# Patient Record
Sex: Male | Born: 1965 | Race: White | Hispanic: No | Marital: Married | Smoking: Never smoker
Health system: Southern US, Community
[De-identification: ages and names within clinical notes are randomized; demographics above are authoritative.]

## PROBLEM LIST (undated history)

## (undated) DIAGNOSIS — Z87442 Personal history of urinary calculi: Secondary | ICD-10-CM

## (undated) DIAGNOSIS — I10 Essential (primary) hypertension: Secondary | ICD-10-CM

---

## 2004-02-23 ENCOUNTER — Emergency Department: Payer: Self-pay | Admitting: Emergency Medicine

## 2006-01-25 ENCOUNTER — Emergency Department: Payer: Self-pay | Admitting: Emergency Medicine

## 2006-04-27 ENCOUNTER — Ambulatory Visit: Payer: Self-pay | Admitting: Urology

## 2011-03-02 HISTORY — PX: SEPTOPLASTY: SUR1290

## 2012-01-01 ENCOUNTER — Emergency Department: Payer: Self-pay | Admitting: Emergency Medicine

## 2012-01-01 LAB — CBC
HCT: 39.7 % — ABNORMAL LOW (ref 40.0–52.0)
HGB: 14.1 g/dL (ref 13.0–18.0)
MCH: 29.6 pg (ref 26.0–34.0)
MCHC: 35.5 g/dL (ref 32.0–36.0)
Platelet: 274 10*3/uL (ref 150–440)

## 2012-01-01 LAB — URINALYSIS, COMPLETE
Bacteria: NONE SEEN
Bilirubin,UR: NEGATIVE
Glucose,UR: 50 mg/dL
Ketone: NEGATIVE
Leukocyte Esterase: NEGATIVE
Nitrite: NEGATIVE
Ph: 8
Protein: NEGATIVE
RBC,UR: 28 /HPF
Specific Gravity: 1.023
Squamous Epithelial: NONE SEEN
WBC UR: 1 /HPF

## 2012-01-01 LAB — COMPREHENSIVE METABOLIC PANEL WITH GFR
Albumin: 3.9 g/dL
Alkaline Phosphatase: 66 U/L
Anion Gap: 7
BUN: 11 mg/dL
Bilirubin,Total: 0.4 mg/dL
Calcium, Total: 8.9 mg/dL
Chloride: 107 mmol/L
Co2: 29 mmol/L
Creatinine: 0.89 mg/dL
EGFR (African American): >60
EGFR (Non-African Amer.): >60
Glucose: 90 mg/dL
Osmolality: 284
Potassium: 3.8 mmol/L
SGOT(AST): 24 U/L
SGPT (ALT): 29 U/L
Sodium: 143 mmol/L
Total Protein: 7.1 g/dL

## 2012-01-01 LAB — PROTIME-INR: Prothrombin Time: 12 secs (ref 11.5–14.7)

## 2012-01-11 ENCOUNTER — Ambulatory Visit: Payer: Self-pay | Admitting: Otolaryngology

## 2012-05-05 ENCOUNTER — Ambulatory Visit: Payer: Self-pay | Admitting: Orthopedic Surgery

## 2012-10-11 ENCOUNTER — Emergency Department: Payer: Self-pay | Admitting: Emergency Medicine

## 2012-10-12 ENCOUNTER — Emergency Department: Payer: Self-pay | Admitting: Emergency Medicine

## 2012-10-24 ENCOUNTER — Ambulatory Visit: Payer: Self-pay | Admitting: Orthopedic Surgery

## 2012-11-22 ENCOUNTER — Ambulatory Visit: Payer: Self-pay | Admitting: Orthopedic Surgery

## 2012-12-05 ENCOUNTER — Emergency Department: Payer: Self-pay | Admitting: Emergency Medicine

## 2012-12-05 LAB — CBC
HCT: 41 % (ref 40.0–52.0)
HGB: 14.3 g/dL (ref 13.0–18.0)
MCHC: 34.9 g/dL (ref 32.0–36.0)
MCV: 84 fL (ref 80–100)
Platelet: 337 10*3/uL (ref 150–440)
WBC: 8.4 10*3/uL (ref 3.8–10.6)

## 2012-12-05 LAB — URINALYSIS, COMPLETE
Glucose,UR: 50 mg/dL (ref 0–75)
Hyaline Cast: 2
Ketone: NEGATIVE
Nitrite: NEGATIVE
Ph: 6 (ref 4.5–8.0)
Protein: 100
RBC,UR: 94 /HPF (ref 0–5)
WBC UR: 4 /HPF (ref 0–5)

## 2012-12-05 LAB — COMPREHENSIVE METABOLIC PANEL
Alkaline Phosphatase: 84 U/L (ref 50–136)
Anion Gap: 4 — ABNORMAL LOW (ref 7–16)
BUN: 12 mg/dL (ref 7–18)
Bilirubin,Total: 1 mg/dL (ref 0.2–1.0)
Calcium, Total: 9.7 mg/dL (ref 8.5–10.1)
Chloride: 102 mmol/L (ref 98–107)
Co2: 30 mmol/L (ref 21–32)
Creatinine: 1.2 mg/dL (ref 0.60–1.30)
EGFR (African American): >60
EGFR (Non-African Amer.): >60
Glucose: 136 mg/dL — ABNORMAL HIGH (ref 65–99)
SGOT(AST): 21 U/L (ref 15–37)
SGPT (ALT): 32 U/L (ref 12–78)

## 2012-12-07 LAB — URINE CULTURE

## 2013-01-02 ENCOUNTER — Ambulatory Visit: Payer: Self-pay | Admitting: Orthopedic Surgery

## 2013-01-03 ENCOUNTER — Encounter: Payer: Self-pay | Admitting: Podiatry

## 2013-01-03 ENCOUNTER — Ambulatory Visit (INDEPENDENT_AMBULATORY_CARE_PROVIDER_SITE_OTHER): Payer: BC Managed Care – PPO | Admitting: Podiatry

## 2013-01-03 VITALS — BP 136/82 | HR 63 | Resp 16 | Ht 70.0 in | Wt 170.0 lb

## 2013-01-03 DIAGNOSIS — Z79899 Other long term (current) drug therapy: Secondary | ICD-10-CM

## 2013-01-03 DIAGNOSIS — B351 Tinea unguium: Secondary | ICD-10-CM

## 2013-01-03 MED ORDER — TERBINAFINE HCL 250 MG PO TABS: 250.0000 mg | ORAL_TABLET | Freq: Every day | ORAL | Status: DC

## 2013-01-03 NOTE — Progress Notes (Signed)
Terez presents today for followup of his lab results. Labs came back positive for fungus.  Objective: Palpable pulses bilateral no calf pain. It dystrophic mycotic nails.  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  Plan: Start oral Lamisil 250 mg 1 by mouth daily. Since him to [facility] for his liver profile and CBC. Should this come back abnormal I will notify him immediately otherwise I will followup with him in one month.

## 2013-01-04 LAB — CBC WITH DIFFERENTIAL/PLATELET
Basos: 1 %
Eos: 4 %
HCT: 39.4 % (ref 37.5–51.0)
Hemoglobin: 13.2 g/dL (ref 12.6–17.7)
Immature Granulocytes: 0 %
Lymphocytes Absolute: 2 10*3/uL (ref 0.7–3.1)
Lymphs: 26 %
MCV: 86 fL (ref 79–97)
Monocytes Absolute: 0.6 10*3/uL (ref 0.1–0.9)
Monocytes: 8 %
Neutrophils Absolute: 4.7 10*3/uL (ref 1.4–7.0)
RBC: 4.56 x10E6/uL (ref 4.14–5.80)
RDW: 13.3 % (ref 12.3–15.4)
WBC: 7.7 10*3/uL (ref 3.4–10.8)

## 2013-01-04 LAB — HEPATIC FUNCTION PANEL
ALT: 26 IU/L (ref 0–44)
AST: 19 IU/L (ref 0–40)
Albumin: 4.5 g/dL (ref 3.5–5.5)
Alkaline Phosphatase: 70 IU/L (ref 39–117)
Bilirubin, Direct: 0.1 mg/dL (ref 0.00–0.40)
Total Bilirubin: 0.3 mg/dL (ref 0.0–1.2)
Total Protein: 6.5 g/dL (ref 6.0–8.5)

## 2013-01-05 ENCOUNTER — Telehealth: Payer: Self-pay | Admitting: *Deleted

## 2013-01-05 NOTE — Telephone Encounter (Signed)
Message copied by Bing Ree on Fri Jan 05, 2013 10:38 AM ------      Message from: Ernestene Kiel T      Created: Thu Jan 04, 2013  1:49 PM       Blood work looks great.  We will follow up with Jonny Ruiz in one month and retest.  Continue Lamisil therapy. ------

## 2013-01-05 NOTE — Telephone Encounter (Signed)
Called and told pt blood work was fine, cont w/ lamisil and will follow up in one month. Pt states "this is the 2nd phone call I received abt this". Told him i was sorry and wouldn't call him again re: this info.

## 2013-01-05 NOTE — Telephone Encounter (Signed)
CALLED AND SPOKE TO James [name] TO LET HIM KNOW HIS BLOODWORK WAS GREAT AND TO CONTINUE THERAPY WILL, HE IS TO FOLLOW UP IN ONE MONTH

## 2013-01-31 ENCOUNTER — Ambulatory Visit (INDEPENDENT_AMBULATORY_CARE_PROVIDER_SITE_OTHER): Payer: BC Managed Care – PPO | Admitting: Podiatry

## 2013-01-31 ENCOUNTER — Encounter: Payer: Self-pay | Admitting: Podiatry

## 2013-01-31 VITALS — BP 118/78 | HR 65 | Resp 16

## 2013-01-31 DIAGNOSIS — Z79899 Other long term (current) drug therapy: Secondary | ICD-10-CM

## 2013-01-31 MED ORDER — TERBINAFINE HCL 250 MG PO TABS: 250.0000 mg | ORAL_TABLET | Freq: Every day | ORAL | Status: DC

## 2013-01-31 NOTE — Progress Notes (Signed)
James [name] presents today for followup of his Lamisil therapy he states these been doing just fine with no problems he denies fever chills nausea vomiting muscle aches and pains and rashes. States that he may have had a headache one time but resolve quite nicely.  Objective: No change in nail plates as of yet.  Assessment: Long-term therapy Lamisil.  Plan: We will continue therapy for another 90 days. A CBC and liver profile were drawn today and I will followup with him should this return abnormal. Otherwise I will see him in 5 months

## 2013-02-01 LAB — CBC WITH DIFFERENTIAL/PLATELET
Basophils Absolute: 0.1 10*3/uL (ref 0.0–0.2)
Eos: 3 %
Hemoglobin: 13.1 g/dL (ref 12.6–17.7)
Immature Granulocytes: 0 %
Lymphocytes Absolute: 1.8 10*3/uL (ref 0.7–3.1)
Lymphs: 31 %
MCHC: 33.9 g/dL (ref 31.5–35.7)
Monocytes: 8 %
Neutrophils Absolute: 3.4 10*3/uL (ref 1.4–7.0)
RDW: 12.8 % (ref 12.3–15.4)
WBC: 5.8 10*3/uL (ref 3.4–10.8)

## 2013-02-01 LAB — HEPATIC FUNCTION PANEL
ALT: 15 IU/L (ref 0–44)
AST: 17 IU/L (ref 0–40)
Alkaline Phosphatase: 67 IU/L (ref 39–117)
Total Bilirubin: 0.3 mg/dL (ref 0.0–1.2)

## 2013-07-02 ENCOUNTER — Ambulatory Visit: Payer: BC Managed Care – PPO | Admitting: Podiatry

## 2013-07-06 ENCOUNTER — Ambulatory Visit: Payer: Self-pay | Admitting: Orthopedic Surgery

## 2014-05-18 IMAGING — US US EXTREM LOW VENOUS*R*
1 series · 14 of 24 positions shown · non-contrast
Comparison: none

REASON FOR EXAM: Right leg pain and swelling Eval for DVT
COMMENTS:

[Series 1: us extrem low venous*right* · 0.09mm/px · 14 of 25 slices shown]
[im 1/25]
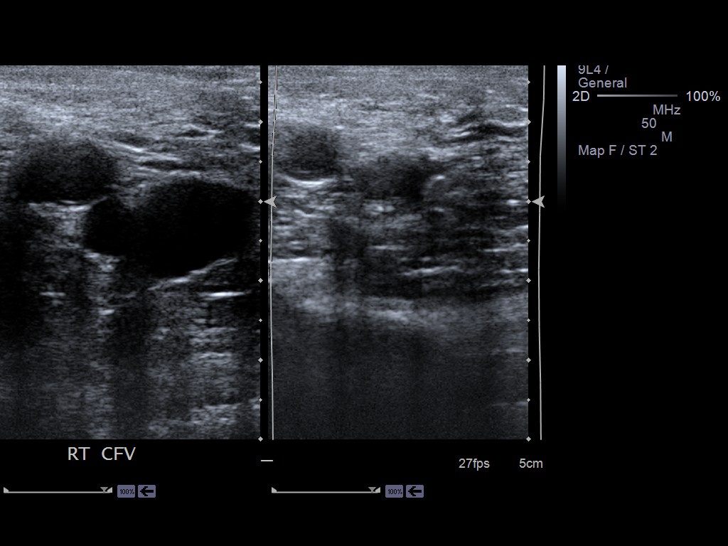
[im 3/25]
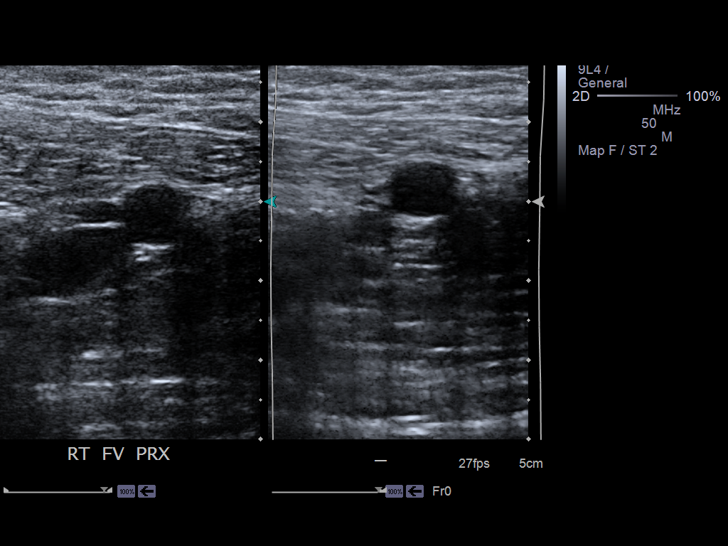
[im 5/25]
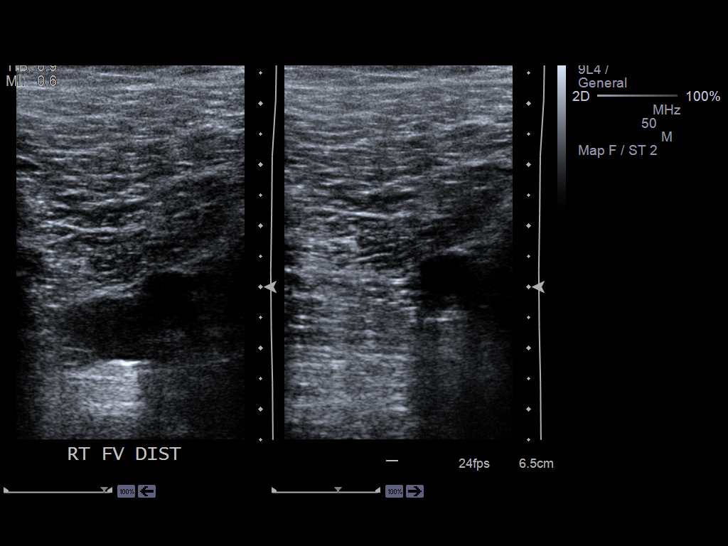
[im 7/25]
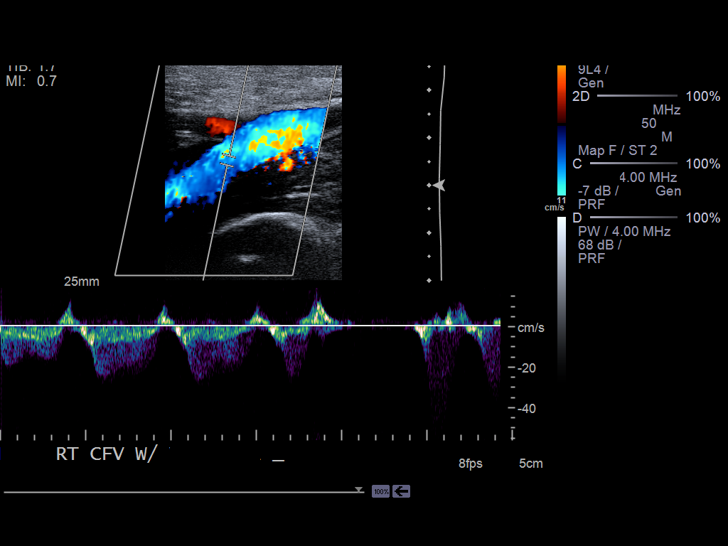
[im 8/25]
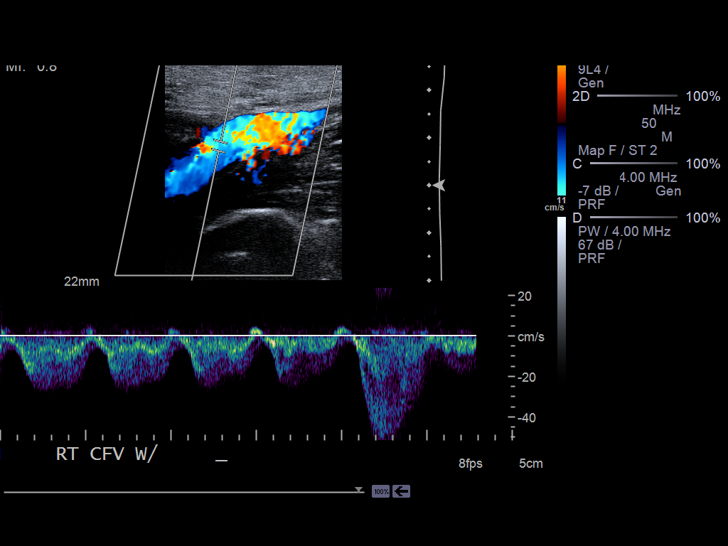
[im 10/25]
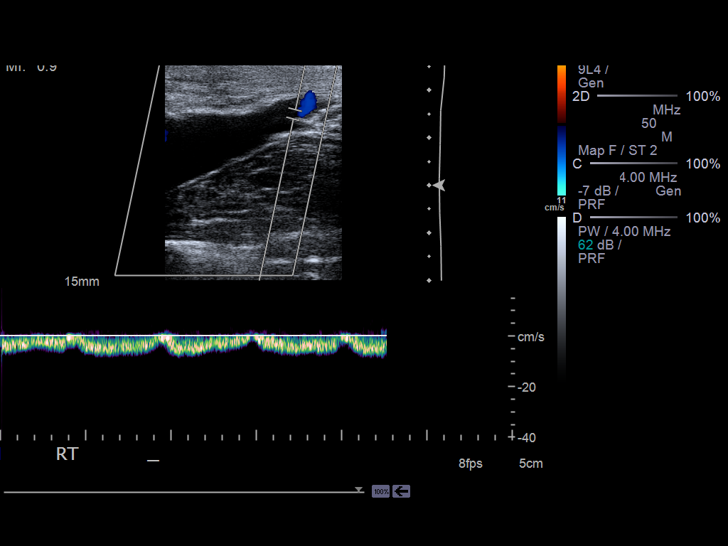
[im 12/25]
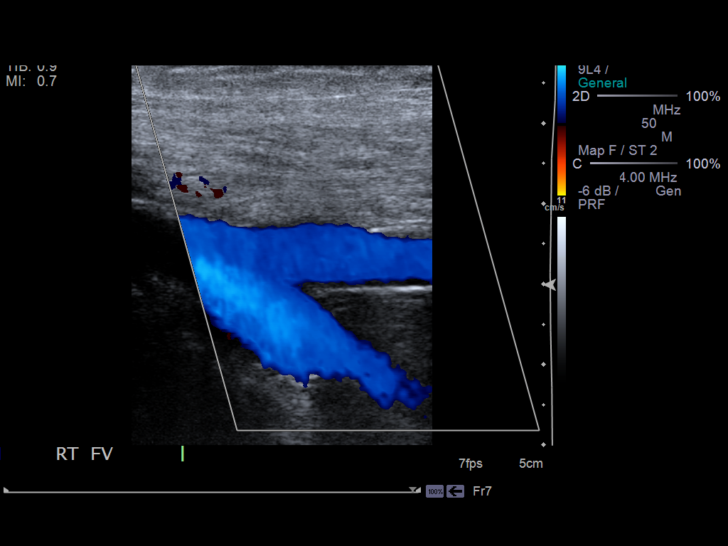
[im 13/25]
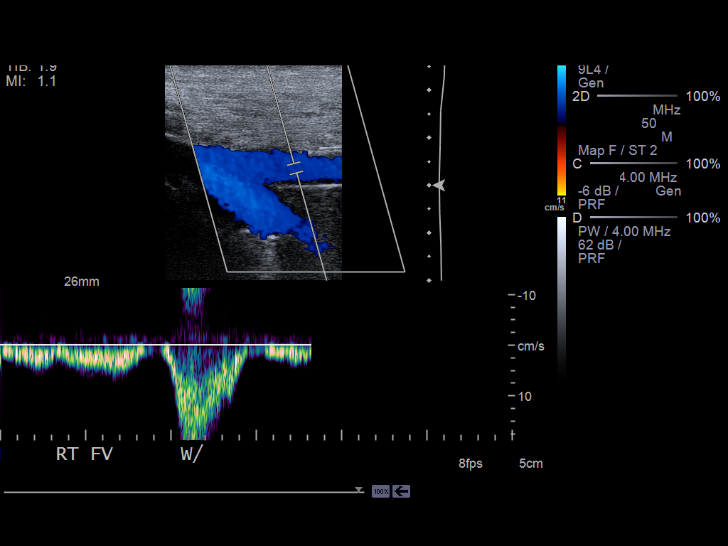
[im 15/25]
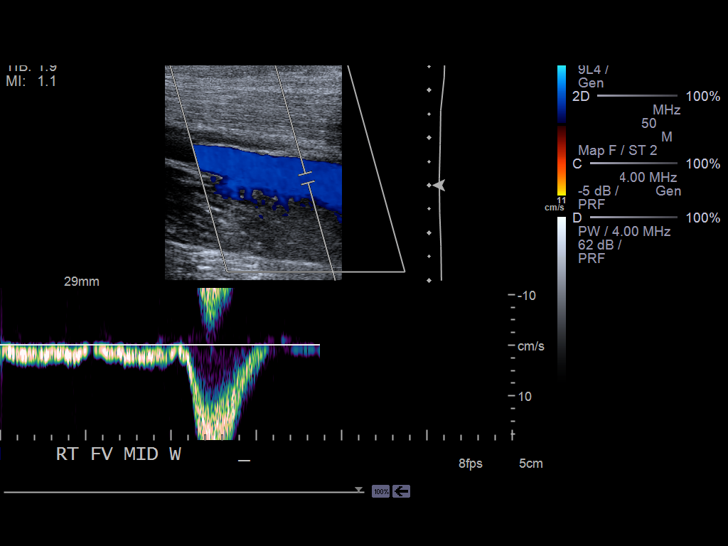
[im 17/25]
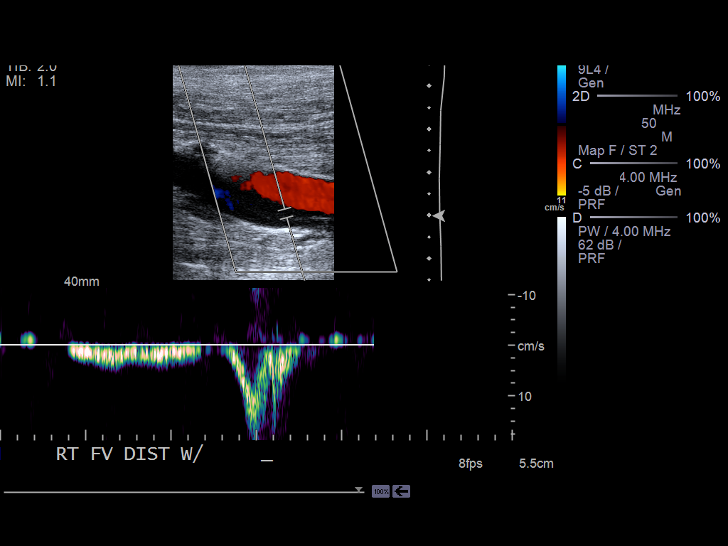
[im 19/25]
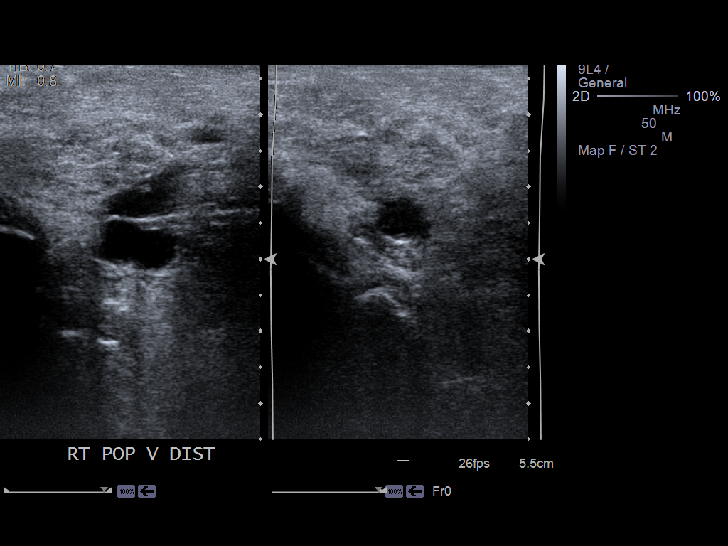
[im 20/25]
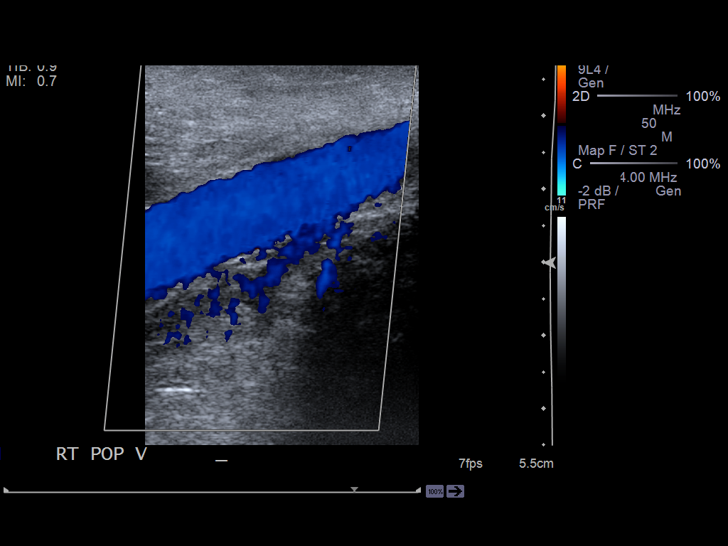
[im 22/25]
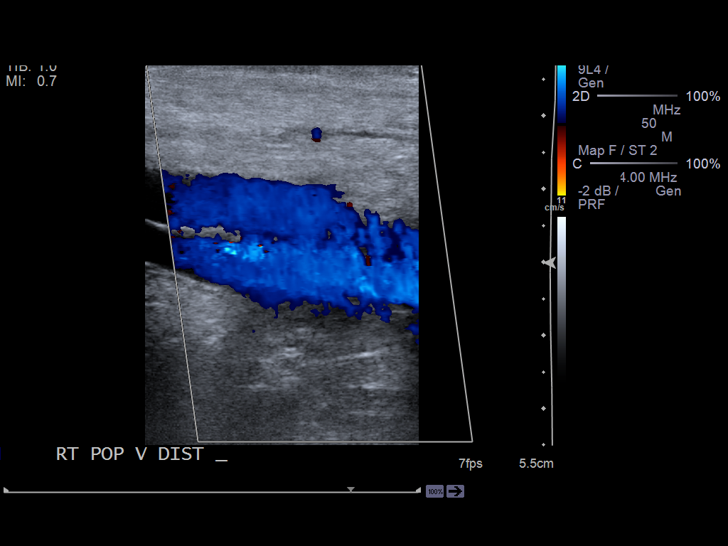
[im 25/25]
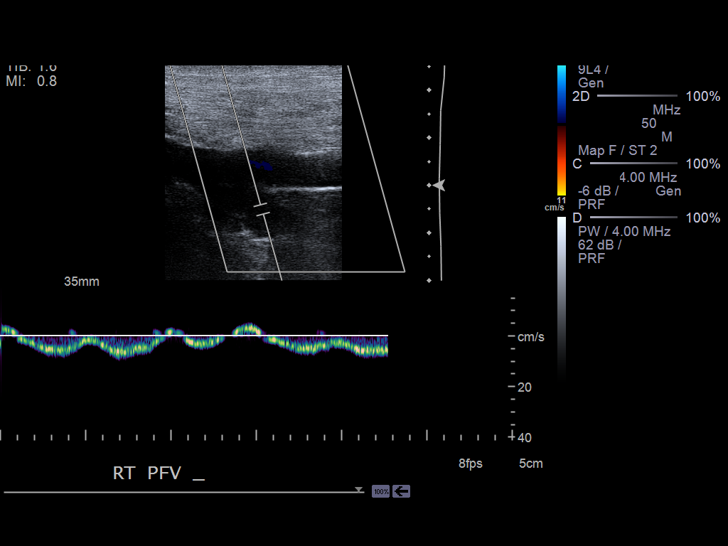

[14 of 24 positions shown; findings below may reference images not displayed]

PROCEDURE:     US  - US DOPPLER LOW EXTR RIGHT  - October 24, 2012  [DATE]

RESULT:     Grayscale and color flow Doppler techniques were employed to
evaluate the deep veins of the right lower extremity.

The right common femoral, superficial femoral, and popliteal veins are
normally compressible. The waveform patterns are normal and the color flow
images are normal. The response to the augmentation and Valsalva maneuvers
is normal.
IMPRESSION: There is no evidence of thrombus within the right femoral
or popliteal veins.

[REDACTED]

## 2014-08-06 ENCOUNTER — Other Ambulatory Visit: Payer: Self-pay | Admitting: Orthopedic Surgery

## 2014-08-06 DIAGNOSIS — M5030 Other cervical disc degeneration, unspecified cervical region: Secondary | ICD-10-CM

## 2014-08-14 ENCOUNTER — Ambulatory Visit
Admission: RE | Admit: 2014-08-14 | Discharge: 2014-08-14 | Disposition: A | Discharge status: Home / Self Care | Payer: BLUE CROSS/BLUE SHIELD | Source: Ambulatory Visit | Attending: Orthopedic Surgery | Admitting: Orthopedic Surgery

## 2014-08-14 DIAGNOSIS — M2578 Osteophyte, vertebrae: Secondary | ICD-10-CM | POA: Diagnosis not present

## 2014-08-14 DIAGNOSIS — M47892 Other spondylosis, cervical region: Secondary | ICD-10-CM | POA: Insufficient documentation

## 2014-08-14 DIAGNOSIS — M5030 Other cervical disc degeneration, unspecified cervical region: Secondary | ICD-10-CM

## 2014-08-14 DIAGNOSIS — M542 Cervicalgia: Secondary | ICD-10-CM | POA: Insufficient documentation

## 2014-09-17 DIAGNOSIS — M5412 Radiculopathy, cervical region: Secondary | ICD-10-CM | POA: Insufficient documentation

## 2015-09-14 ENCOUNTER — Emergency Department: Payer: BLUE CROSS/BLUE SHIELD

## 2015-09-14 ENCOUNTER — Emergency Department
Admission: EM | Admit: 2015-09-14 | Discharge: 2015-09-15 | Disposition: A | Discharge status: Home / Self Care | Payer: BLUE CROSS/BLUE SHIELD | Attending: Emergency Medicine | Admitting: Emergency Medicine

## 2015-09-14 ENCOUNTER — Encounter: Payer: Self-pay | Admitting: Emergency Medicine

## 2015-09-14 DIAGNOSIS — N50819 Testicular pain, unspecified: Secondary | ICD-10-CM | POA: Insufficient documentation

## 2015-09-14 DIAGNOSIS — N200 Calculus of kidney: Secondary | ICD-10-CM | POA: Insufficient documentation

## 2015-09-14 DIAGNOSIS — R109 Unspecified abdominal pain: Secondary | ICD-10-CM

## 2015-09-14 DIAGNOSIS — N50812 Left testicular pain: Secondary | ICD-10-CM

## 2015-09-14 DIAGNOSIS — R1032 Left lower quadrant pain: Secondary | ICD-10-CM | POA: Diagnosis present

## 2015-09-14 LAB — COMPREHENSIVE METABOLIC PANEL
ALT: 28 U/L (ref 17–63)
ANION GAP: 9 (ref 5–15)
AST: 24 U/L (ref 15–41)
Albumin: 4.9 g/dL (ref 3.5–5.0)
Alkaline Phosphatase: 67 U/L (ref 38–126)
BUN: 22 mg/dL — AB (ref 6–20)
CHLORIDE: 106 mmol/L (ref 101–111)
CO2: 27 mmol/L (ref 22–32)
Calcium: 9.6 mg/dL (ref 8.9–10.3)
Creatinine, Ser: 1.29 mg/dL — ABNORMAL HIGH (ref 0.61–1.24)
Glucose, Bld: 125 mg/dL — ABNORMAL HIGH (ref 65–99)
POTASSIUM: 4.6 mmol/L (ref 3.5–5.1)
Sodium: 142 mmol/L (ref 135–145)
TOTAL PROTEIN: 7.9 g/dL (ref 6.5–8.1)
Total Bilirubin: 0.7 mg/dL (ref 0.3–1.2)

## 2015-09-14 LAB — URINALYSIS COMPLETE WITH MICROSCOPIC (ARMC ONLY)
BACTERIA UA: NONE SEEN
Bilirubin Urine: NEGATIVE
Glucose, UA: 150 mg/dL — AB
Ketones, ur: NEGATIVE mg/dL
LEUKOCYTES UA: NEGATIVE
NITRITE: NEGATIVE
PH: 6 (ref 5.0–8.0)
Protein, ur: 30 mg/dL — AB
SPECIFIC GRAVITY, URINE: 1.019 (ref 1.005–1.030)
SQUAMOUS EPITHELIAL / LPF: NONE SEEN

## 2015-09-14 LAB — LIPASE, BLOOD: LIPASE: 32 U/L (ref 11–51)

## 2015-09-14 LAB — CBC
HEMATOCRIT: 43.3 % (ref 40.0–52.0)
HEMOGLOBIN: 14.6 g/dL (ref 13.0–18.0)
MCH: 28.2 pg (ref 26.0–34.0)
MCHC: 33.8 g/dL (ref 32.0–36.0)
MCV: 83.6 fL (ref 80.0–100.0)
Platelets: 303 10*3/uL (ref 150–440)
RBC: 5.17 MIL/uL (ref 4.40–5.90)
RDW: 13.1 % (ref 11.5–14.5)
WBC: 13.8 10*3/uL — AB (ref 3.8–10.6)

## 2015-09-14 MED ORDER — ONDANSETRON HCL 4 MG PO TABS: 4.0000 mg | ORAL_TABLET | Freq: Every day | ORAL | Status: DC | PRN

## 2015-09-14 MED ORDER — OXYCODONE-ACETAMINOPHEN 5-325 MG PO TABS: 1.0000 | ORAL_TABLET | Freq: Four times a day (QID) | ORAL | Status: DC | PRN

## 2015-09-14 MED ORDER — FENTANYL CITRATE (PF) 100 MCG/2ML IJ SOLN
50.0000 ug | INTRAMUSCULAR | Status: DC | PRN
  Administered 2015-09-14: 50 ug via NASAL
  Filled 2015-09-14: qty 2

## 2015-09-14 MED ORDER — TAMSULOSIN HCL 0.4 MG PO CAPS: 0.4000 mg | ORAL_CAPSULE | Freq: Every day | ORAL | Status: DC

## 2015-09-14 MED ORDER — MORPHINE SULFATE (PF) 4 MG/ML IV SOLN
4.0000 mg | Freq: Once | INTRAVENOUS | Status: AC
  Administered 2015-09-14: 4 mg via INTRAVENOUS
  Filled 2015-09-14: qty 1

## 2015-09-14 MED ORDER — IBUPROFEN 200 MG PO TABS: 600.0000 mg | ORAL_TABLET | Freq: Four times a day (QID) | ORAL | Status: DC | PRN

## 2015-09-14 NOTE — ED Notes (Signed)
Pt states sudden onset L sided testicle pain that started yesterday. Pt states that he laid down and took some tylenol with some relief. Pt denies injury to the area, or discoloration noted to his scrotum. Pt states pain 7/10, feels like his L testicle is in a vice. Pt states the pain radiates up the LLQ of his abdomen. Pt states he feels nauseous, reports 2 episodes of vomiting at this time.

## 2015-09-14 NOTE — ED Notes (Signed)
Pt back from ct and medicated as ordered. Vss. Pt states pain down from 7 to a 4. Pt given urinal for urine sample.

## 2015-09-14 NOTE — ED Provider Notes (Signed)
[facility] [address] [facility] Provider Note  ____________________________________________   I have reviewed the triage vital signs and the nursing notes.   HISTORY  Chief Complaint Testicle Pain and Abdominal Pain    HPI James [name] is a 50 y.o. male with a history of kidney stones, presents today with severe testicular pain which seems to originate from the left lower abdomen. Patient has had no fever or chills no dysuria no urinary frequency has not noticed any hematuria no diarrhea no cough no chest pain. Pain began suddenly yesterday. Does not feel to his recollection like prior kidney stones. Does not have a flank or back pain.      History reviewed. No pertinent past medical history.  There are no active problems to display for this patient.   History reviewed. No pertinent past surgical history.  Current Outpatient Rx  Name  Route  Sig  Dispense  Refill  . terbinafine (LAMISIL) 250 MG tablet   Oral   Take 1 tablet (250 mg total) by mouth daily.   30 tablet   0   . terbinafine (LAMISIL) 250 MG tablet   Oral   Take 1 tablet (250 mg total) by mouth daily.   90 tablet   0     Allergies Review of patient's allergies indicates no known allergies.  History reviewed. No pertinent family history.  Social History Social History  Substance Use Topics  . Smoking status: Never Smoker   . Smokeless tobacco: Never Used  . Alcohol Use: No    Review of Systems Constitutional: No fever/chills Eyes: No visual changes. ENT: No sore throat. No stiff neck no neck pain Cardiovascular: Denies chest pain. Respiratory: Denies shortness of breath. Gastrointestinal:   no vomiting.  No diarrhea.  No constipation. Genitourinary: Negative for dysuria. Musculoskeletal: Negative lower extremity swelling Skin: Negative for rash. Neurological: Negative for headaches, focal weakness or numbness. 10-point ROS otherwise  negative.  ____________________________________________   PHYSICAL EXAM:  VITAL SIGNS: ED Triage Vitals  Enc Vitals Group     BP 09/14/15 1828 149/92 mmHg     Pulse Rate 09/14/15 1828 55     Resp 09/14/15 1828 20     Temp 09/14/15 1828 98.1 F (36.7 C)     Temp Source 09/14/15 1828 Oral     SpO2 09/14/15 1828 100 %     Weight 09/14/15 1828 170 lb (77.111 kg)     Height 09/14/15 1828 5\' 10"  (1.778 m)     Head Cir --      Peak Flow --      Pain Score 09/14/15 1829 7     Pain Loc --      Pain Edu? --      Excl. in GC? --     Constitutional: Alert and oriented. Well appearing and in no acute distress. Eyes: Conjunctivae are normal. PERRL. EOMI. Head: Atraumatic. Nose: No congestion/rhinnorhea. Mouth/Throat: Mucous membranes are moist.  Oropharynx non-erythematous. Neck: No stridor.   Nontender with no meningismus Cardiovascular: Normal rate, regular rhythm. Grossly normal heart sounds.  Good peripheral circulation. Respiratory: Normal respiratory effort.  No retractions. Lungs CTAB. Abdominal: Soft and nontender. No distention. No guarding no rebound Back:  There is no focal tenderness or step off there is no midline tenderness there are no lesions noted. there is no CVA tenderness Normal nontender external male genitalia Musculoskeletal: No lower extremity tenderness. No joint effusions, no DVT signs strong distal pulses no edema Neurologic:  Normal  speech and language. No gross focal neurologic deficits are appreciated.  Skin:  Skin is warm, dry and intact. No rash noted. Psychiatric: Mood and affect are normal. Speech and behavior are normal.  ____________________________________________   LABS (all labs ordered are listed, but only abnormal results are displayed)  Labs Reviewed  COMPREHENSIVE METABOLIC PANEL - Abnormal; Notable for the following:    Glucose, Bld 125 (*)    BUN 22 (*)    Creatinine, Ser 1.29 (*)    All other components within normal limits  CBC -  Abnormal; Notable for the following:    WBC 13.8 (*)    All other components within normal limits  URINALYSIS COMPLETEWITH MICROSCOPIC ([facility] ONLY) - Abnormal; Notable for the following:    Color, Urine YELLOW (*)    APPearance CLEAR (*)    Glucose, UA 150 (*)    Hgb urine dipstick 3+ (*)    Protein, ur 30 (*)    All other components within normal limits  LIPASE, BLOOD   ____________________________________________  EKG  I personally interpreted any EKGs ordered by me or triage  ____________________________________________  RADIOLOGY  I reviewed any imaging ordered by me or triage that were performed during my shift and, if possible, patient and/or family made aware of any abnormal findings. ____________________________________________   PROCEDURES  Procedure(s) performed: None  Critical Care performed: None  ____________________________________________   INITIAL IMPRESSION / ASSESSMENT AND PLAN / ED COURSE  Pertinent labs & imaging results that were available during my care of the patient were reviewed by me and considered in my medical decision making (see chart for details).  Patient has a kidney stone, it is 6 mm and the partial ureter which splinted his symptoms. His pain is very well controlled at this time. He is requesting further pain medication. He would like to go home. We'll send him home with outpatient follow-up with urology, will avoid Toradol as they prefer not to have that here we'll send him home with nonsteroidal pain medication Percocet's Flomax and Zofran. I presents follow-up given and understood patient were comfortable with the plan. ____________________________________________   FINAL CLINICAL IMPRESSION(S) / ED DIAGNOSES  Final diagnoses:  Abdominal pain  Kidney stone      This chart was dictated using voice recognition software.  Despite best efforts to proofread,  errors can occur which can change meaning.     Jeanmarie Plant,  MD 09/14/15 [phone]

## 2015-09-17 ENCOUNTER — Ambulatory Visit: Payer: BLUE CROSS/BLUE SHIELD

## 2015-09-18 ENCOUNTER — Encounter: Payer: Self-pay | Admitting: Urology

## 2015-09-18 ENCOUNTER — Ambulatory Visit (INDEPENDENT_AMBULATORY_CARE_PROVIDER_SITE_OTHER): Payer: BLUE CROSS/BLUE SHIELD | Admitting: Urology

## 2015-09-18 VITALS — BP 107/68 | HR 66 | Ht 70.0 in | Wt 171.7 lb

## 2015-09-18 DIAGNOSIS — N200 Calculus of kidney: Secondary | ICD-10-CM

## 2015-09-18 LAB — URINALYSIS, COMPLETE
Bilirubin, UA: NEGATIVE
Glucose, UA: NEGATIVE
Ketones, UA: NEGATIVE
Leukocytes, UA: NEGATIVE
Nitrite, UA: NEGATIVE
Protein, UA: NEGATIVE
Specific Gravity, UA: 1.025 (ref 1.005–1.030)
Urobilinogen, Ur: 0.2 mg/dL (ref 0.2–1.0)
pH, UA: 5 (ref 5.0–7.5)

## 2015-09-18 LAB — MICROSCOPIC EXAMINATION
Bacteria, UA: NONE SEEN
RBC, UA: >30 /hpf — AB (ref 0–?)

## 2015-09-18 MED ORDER — TAMSULOSIN HCL 0.4 MG PO CAPS: 0.4000 mg | ORAL_CAPSULE | Freq: Every day | ORAL | Status: DC

## 2015-09-18 NOTE — Progress Notes (Signed)
09/18/2015 3:50 PM   James [name] 1966/02/18 [id number]  Referring provider: No referring provider defined for this encounter.  Chief Complaint  Patient presents with  . Nephrolithiasis    HPI: The patient is a 50 year old gentleman presents for evaluation of being diagnosed with a left 6 Milner proximal ureteral stone. He also has a stone in the lower pole of his left kidney as well.  He has passed 4 stones in his lifetime. His previous stones a few years ago. His pain is well controlled now. He is not taking narcotics any longer. His pain is well-controlled. He has not passed the stone. He has strained his urine.   PMH: No past medical history on file.  Surgical History: No past surgical history on file.  Home Medications:    Medication List       This list is accurate as of: 09/18/15  3:50 PM.  Always use your most recent med list.               ibuprofen 600 MG tablet  Commonly known as:  ADVIL,MOTRIN     ondansetron 4 MG tablet  Commonly known as:  ZOFRAN  Take 1 tablet (4 mg total) by mouth daily as needed for nausea or vomiting.     oxyCODONE-acetaminophen 5-325 MG tablet  Commonly known as:  ROXICET  Take 1 tablet by mouth every 6 (six) hours as needed.     tamsulosin 0.4 MG Caps capsule  Commonly known as:  FLOMAX  Take 1 capsule (0.4 mg total) by mouth daily.     tamsulosin 0.4 MG Caps capsule  Commonly known as:  FLOMAX  Take 1 capsule (0.4 mg total) by mouth daily.        Allergies:  Allergies  Allergen Reactions  . Etodolac     Other reaction(s): Other (See Comments) Acid reflux.    Family History: No family history on file.  Social History:  reports that he has never smoked. He has never used smokeless tobacco. He reports that he does not drink alcohol or use illicit drugs.  ROS:                                        Physical Exam: BP 107/68 mmHg  Pulse 66  Ht 5\' 10"  (1.778 m)  Wt 171 lb 11.2 oz  (77.883 kg)  BMI 24.64 kg/m2  Constitutional:  Alert and oriented, No acute distress. HEENT: [place] AT, moist mucus membranes.  Trachea midline, no masses. Cardiovascular: No clubbing, cyanosis, or edema. Respiratory: Normal respiratory effort, no increased work of breathing. GI: Abdomen is soft, nontender, nondistended, no abdominal masses GU: No CVA tenderness.  Skin: No rashes, bruises or suspicious lesions. Lymph: No cervical or inguinal adenopathy. Neurologic: Grossly intact, no focal deficits, moving all 4 extremities. Psychiatric: Normal mood and affect.  Laboratory Data: Lab Results  Component Value Date   WBC 13.8* 09/14/2015   HGB 14.6 09/14/2015   HCT 43.3 09/14/2015   MCV 83.6 09/14/2015   PLT 303 09/14/2015    Lab Results  Component Value Date   CREATININE 1.29* 09/14/2015    No results found for: PSA  No results found for: TESTOSTERONE  No results found for: HGBA1C  Urinalysis    Component Value Date/Time   COLORURINE YELLOW* 09/14/2015 2243   COLORURINE Yellow 12/05/2012 1154   APPEARANCEUR CLEAR* 09/14/2015 2243   APPEARANCEUR  Hazy 12/05/2012 1154   LABSPEC 1.019 09/14/2015 2243   LABSPEC 1.018 12/05/2012 1154   PHURINE 6.0 09/14/2015 2243   PHURINE 6.0 12/05/2012 1154   GLUCOSEU 150* 09/14/2015 2243   GLUCOSEU 50 mg/dL 40/98/1191 4782   HGBUR 3+* 09/14/2015 2243   HGBUR 3+ 12/05/2012 1154   BILIRUBINUR NEGATIVE 09/14/2015 2243   BILIRUBINUR Negative 12/05/2012 1154   KETONESUR NEGATIVE 09/14/2015 2243   KETONESUR Negative 12/05/2012 1154   PROTEINUR 30* 09/14/2015 2243   PROTEINUR 100 mg/dL 95/62/1308 6578   NITRITE NEGATIVE 09/14/2015 2243   NITRITE Negative 12/05/2012 1154   LEUKOCYTESUR NEGATIVE 09/14/2015 2243   LEUKOCYTESUR Negative 12/05/2012 1154    Pertinent Imaging: Study Result     CLINICAL DATA: Left-sided abdominal and testicular pain.  EXAM: CT ABDOMEN AND PELVIS WITHOUT CONTRAST  TECHNIQUE: Multidetector CT imaging  of the abdomen and pelvis was performed following the standard protocol without IV contrast.  COMPARISON: December 05, 2012  FINDINGS: Normal lung bases.  No free air or free fluid. There is a 3 mm stone in the lower pole of the left kidney. No other renal stones identified.  There is mild hydronephrosis on the left due to a 6 mm stone in the proximal left ureter. No other ureteral stones identified.  The liver, gallbladder, spleen, adrenal glands, and pancreas are normal. No aneurysm or adenopathy. The stomach and small bowel are normal. The colon demonstrates no acute abnormalities. The appendix is normal.  There is a calcification in the pelvis which is unchanged and of no acute significance. No adenopathy or suspicious mass. The prostate, seminal vesicles, and bladder are normal.  Visualized bones are unremarkable. Degenerative changes in the spine.  IMPRESSION: 1. 6 mm stone in the proximal left ureter with mild hydronephrosis.     Assessment & Plan:    1. Left ureteral stone  2. Left nonobstructing renal calculus Continue medical expulsive therapy list on Flomax, pain control, and straining of urine. Patient was advised to contact the office in the ER if his pain becomes uncontrolled or develops unexplained fever. The patient will follow-up in 2 weeks for KUB prior.  Return in about 2 weeks (around 10/02/2015) for with KUB prior.  Hildred Laser, MD  [facility] [address] [place], [place] [address] [phone]

## 2015-09-24 ENCOUNTER — Telehealth: Payer: Self-pay

## 2015-09-24 DIAGNOSIS — N200 Calculus of kidney: Secondary | ICD-10-CM

## 2015-09-24 NOTE — Telephone Encounter (Signed)
Pt called requesting more pain medication and flomax for kidney stones. Please advise.

## 2015-09-25 ENCOUNTER — Other Ambulatory Visit: Payer: Self-pay

## 2015-09-25 DIAGNOSIS — N200 Calculus of kidney: Secondary | ICD-10-CM

## 2015-09-25 MED ORDER — OXYCODONE-ACETAMINOPHEN 5-325 MG PO TABS: 1.0000 | ORAL_TABLET | Freq: Four times a day (QID) | ORAL | 0 refills | Status: DC | PRN

## 2015-09-25 MED ORDER — OXYCODONE-ACETAMINOPHEN 5-325 MG PO TABS
1.0000 | ORAL_TABLET | Freq: Four times a day (QID) | ORAL | 0 refills | Status: DC | PRN
Start: 2015-09-25 — End: 2017-02-16

## 2015-09-25 MED ORDER — IBUPROFEN 600 MG PO TABS: 600.0000 mg | ORAL_TABLET | Freq: Four times a day (QID) | ORAL | 0 refills | Status: DC | PRN

## 2015-09-25 NOTE — Telephone Encounter (Signed)
Ok to Rx flomax 04 mg x30. Percocet 5/325 x30

## 2015-09-25 NOTE — Telephone Encounter (Signed)
Medications refilled and pt made aware.

## 2015-10-02 ENCOUNTER — Ambulatory Visit: Payer: BLUE CROSS/BLUE SHIELD

## 2016-11-05 ENCOUNTER — Other Ambulatory Visit: Payer: Self-pay | Admitting: Podiatry

## 2016-11-05 DIAGNOSIS — M25572 Pain in left ankle and joints of left foot: Secondary | ICD-10-CM

## 2016-11-09 ENCOUNTER — Ambulatory Visit: Payer: BLUE CROSS/BLUE SHIELD

## 2017-02-09 ENCOUNTER — Ambulatory Visit
Admission: RE | Admit: 2017-02-09 | Discharge: 2017-02-09 | Disposition: A | Discharge status: Home / Self Care | Payer: BLUE CROSS/BLUE SHIELD | Source: Ambulatory Visit | Attending: Podiatry | Admitting: Podiatry

## 2017-02-09 DIAGNOSIS — M7672 Peroneal tendinitis, left leg: Secondary | ICD-10-CM | POA: Insufficient documentation

## 2017-02-09 DIAGNOSIS — M7732 Calcaneal spur, left foot: Secondary | ICD-10-CM | POA: Insufficient documentation

## 2017-02-09 DIAGNOSIS — M25572 Pain in left ankle and joints of left foot: Secondary | ICD-10-CM

## 2017-02-09 DIAGNOSIS — M93272 Osteochondritis dissecans, left ankle and joints of left foot: Secondary | ICD-10-CM | POA: Diagnosis not present

## 2017-02-23 ENCOUNTER — Encounter
Admission: RE | Admit: 2017-02-23 | Discharge: 2017-02-23 | Disposition: A | Discharge status: Home / Self Care | Payer: BLUE CROSS/BLUE SHIELD | Source: Ambulatory Visit | Attending: Podiatry | Admitting: Podiatry

## 2017-02-23 ENCOUNTER — Other Ambulatory Visit: Payer: Self-pay

## 2017-02-23 ENCOUNTER — Other Ambulatory Visit: Payer: Self-pay | Admitting: Podiatry

## 2017-02-23 HISTORY — DX: Personal history of urinary calculi: Z87.442

## 2017-02-23 NOTE — Patient Instructions (Signed)
  Your procedure is scheduled on: 02-25-17 Report to Same Day Surgery 2nd floor medical mall [facility](Medical Mall Entrance-take elevator on left to 2nd floor.  Check in with surgery information desk.) To find out your arrival time please call [phone] between 1PM - 3PM on 02-24-17  Remember: Instructions that are not followed completely may result in serious medical risk, up to and including death, or upon the discretion of your surgeon and anesthesiologist your surgery may need to be rescheduled.    _x___ 1. Do not eat food after midnight the night before your procedure. NO GUM OR CANDY AFTER MIDNIGHT.  You may drink clear liquids up to 2 hours before you are scheduled to arrive at the hospital for your procedure.  Do not drink clear liquids within 2 hours of your scheduled arrival to the hospital.  Clear liquids include  --Water or Apple juice without pulp  --Clear carbohydrate beverage such as ClearFast or Gatorade  --Black Coffee or Clear Tea (No milk, no creamers, do not add anything to the coffee or Tea .     __x__ 2. No Alcohol for 24 hours before or after surgery.   __x__3. No Smoking for 24 prior to surgery.   ____  4. Bring all medications with you on the day of surgery if instructed.    __x__ 5. Notify your doctor if there is any change in your medical condition     (cold, fever, infections).     Do not wear jewelry, make-up, hairpins, clips or nail polish.  Do not wear lotions, powders, or perfumes. You may wear deodorant.  Do not shave 48 hours prior to surgery. Men may shave face and neck.  Do not bring valuables to the hospital.    [facility] is not responsible for any belongings or valuables.               Contacts, dentures or bridgework may not be worn into surgery.  Leave your suitcase in the car. After surgery it may be brought to your room.  For patients admitted to the hospital, discharge time is determined by your treatment team.   Patients discharged the day  of surgery will not be allowed to drive home.  You will need someone to drive you home and stay with you the night of your procedure.     ____ Take anti-hypertensive listed below, cardiac, seizure, asthma, anti-reflux and psychiatric medicines. These include:  1. NONE  2.  3.  4.  5.  6.  ____Fleets enema or Magnesium Citrate as directed.   ____ Use CHG Soap or sage wipes as directed on instruction sheet   ____ Use inhalers on the day of surgery and bring to hospital day of surgery  ____ Stop Metformin and Janumet 2 days prior to surgery.    ____ Take 1/2 of usual insulin dose the night before surgery and none on the morning surgery.   ____ Follow recommendations from Cardiologist, Pulmonologist or PCP regarding stopping Aspirin, Coumadin, Plavix ,Eliquis, Effient, or Pradaxa, and Pletal.  X____Stop Anti-inflammatories such as Advil, Aleve, Ibuprofen, Motrin, Naproxen, Naprosyn, Goodies powders or aspirin products NOW-OK to take Tylenol    ____ Stop supplements until after surgery.    ____ Bring C-Pap to the hospital.

## 2017-02-24 MED ORDER — CEFAZOLIN SODIUM-DEXTROSE 2-4 GM/100ML-% IV SOLN
2.0000 g | INTRAVENOUS | Status: AC
  Administered 2017-02-25: 2 g via INTRAVENOUS

## 2017-02-25 ENCOUNTER — Ambulatory Visit
Admission: RE | Admit: 2017-02-25 | Discharge: 2017-02-25 | Disposition: A | Discharge status: Home / Self Care | Payer: BLUE CROSS/BLUE SHIELD | Source: Ambulatory Visit | Attending: Podiatry | Admitting: Podiatry

## 2017-02-25 ENCOUNTER — Ambulatory Visit: Payer: BLUE CROSS/BLUE SHIELD | Admitting: Anesthesiology

## 2017-02-25 ENCOUNTER — Encounter
Admission: RE | Disposition: A | Discharge status: Home / Self Care | Payer: Self-pay | Source: Ambulatory Visit | Attending: Podiatry

## 2017-02-25 ENCOUNTER — Other Ambulatory Visit: Payer: Self-pay

## 2017-02-25 DIAGNOSIS — M7672 Peroneal tendinitis, left leg: Secondary | ICD-10-CM | POA: Insufficient documentation

## 2017-02-25 DIAGNOSIS — Z888 Allergy status to other drugs, medicaments and biological substances status: Secondary | ICD-10-CM | POA: Insufficient documentation

## 2017-02-25 DIAGNOSIS — M24072 Loose body in left ankle: Secondary | ICD-10-CM | POA: Insufficient documentation

## 2017-02-25 DIAGNOSIS — M19072 Primary osteoarthritis, left ankle and foot: Secondary | ICD-10-CM | POA: Insufficient documentation

## 2017-02-25 DIAGNOSIS — M216X2 Other acquired deformities of left foot: Secondary | ICD-10-CM | POA: Insufficient documentation

## 2017-02-25 DIAGNOSIS — M65872 Other synovitis and tenosynovitis, left ankle and foot: Secondary | ICD-10-CM | POA: Diagnosis not present

## 2017-02-25 HISTORY — PX: ANKLE ARTHROSCOPY: SHX545

## 2017-02-25 SURGERY — ARTHROSCOPY, ANKLE
Anesthesia: General | Site: Ankle | Laterality: Left | Wound class: Clean

## 2017-02-25 MED ORDER — FENTANYL CITRATE (PF) 100 MCG/2ML IJ SOLN
INTRAMUSCULAR | Status: AC
  Filled 2017-02-25: qty 2

## 2017-02-25 MED ORDER — ONDANSETRON HCL 4 MG/2ML IJ SOLN
INTRAMUSCULAR | Status: DC | PRN
  Administered 2017-02-25: 4 mg via INTRAVENOUS

## 2017-02-25 MED ORDER — FENTANYL CITRATE (PF) 100 MCG/2ML IJ SOLN
50.0000 ug | Freq: Once | INTRAMUSCULAR | Status: AC
  Administered 2017-02-25: 50 ug via INTRAVENOUS

## 2017-02-25 MED ORDER — CEFAZOLIN SODIUM-DEXTROSE 2-4 GM/100ML-% IV SOLN
INTRAVENOUS | Status: AC
  Filled 2017-02-25: qty 100

## 2017-02-25 MED ORDER — OXYCODONE-ACETAMINOPHEN 5-325 MG PO TABS: 1.0000 | ORAL_TABLET | Freq: Four times a day (QID) | ORAL | 0 refills | Status: AC | PRN

## 2017-02-25 MED ORDER — LIDOCAINE HCL (PF) 2 % IJ SOLN
INTRAMUSCULAR | Status: AC
  Filled 2017-02-25: qty 10

## 2017-02-25 MED ORDER — MIDAZOLAM HCL 2 MG/2ML IJ SOLN
INTRAMUSCULAR | Status: AC
  Administered 2017-02-25: 1 mg via INTRAVENOUS
  Filled 2017-02-25: qty 2

## 2017-02-25 MED ORDER — LIDOCAINE-EPINEPHRINE (PF) 1 %-1:200000 IJ SOLN
INTRAMUSCULAR | Status: AC
  Filled 2017-02-25: qty 30

## 2017-02-25 MED ORDER — PROPOFOL 10 MG/ML IV BOLUS
INTRAVENOUS | Status: DC | PRN
  Administered 2017-02-25: 200 mg via INTRAVENOUS

## 2017-02-25 MED ORDER — MIDAZOLAM HCL 2 MG/2ML IJ SOLN
1.0000 mg | Freq: Once | INTRAMUSCULAR | Status: AC
  Administered 2017-02-25: 1 mg via INTRAVENOUS

## 2017-02-25 MED ORDER — FAMOTIDINE 20 MG PO TABS
20.0000 mg | ORAL_TABLET | Freq: Once | ORAL | Status: AC
  Administered 2017-02-25: 20 mg via ORAL

## 2017-02-25 MED ORDER — MIDAZOLAM HCL 5 MG/5ML IJ SOLN
INTRAMUSCULAR | Status: DC | PRN
  Administered 2017-02-25: 2 mg via INTRAVENOUS

## 2017-02-25 MED ORDER — LIDOCAINE HCL (PF) 1 % IJ SOLN
INTRAMUSCULAR | Status: AC
  Filled 2017-02-25: qty 5

## 2017-02-25 MED ORDER — PROPOFOL 500 MG/50ML IV EMUL
INTRAVENOUS | Status: AC
  Filled 2017-02-25: qty 50

## 2017-02-25 MED ORDER — FENTANYL CITRATE (PF) 100 MCG/2ML IJ SOLN
INTRAMUSCULAR | Status: DC | PRN
  Administered 2017-02-25 (×2): 50 ug via INTRAVENOUS

## 2017-02-25 MED ORDER — MIDAZOLAM HCL 2 MG/2ML IJ SOLN
INTRAMUSCULAR | Status: AC
  Filled 2017-02-25: qty 2

## 2017-02-25 MED ORDER — PROMETHAZINE HCL 25 MG/ML IJ SOLN: 6.2500 mg | INTRAMUSCULAR | Status: DC | PRN

## 2017-02-25 MED ORDER — FENTANYL CITRATE (PF) 100 MCG/2ML IJ SOLN
INTRAMUSCULAR | Status: AC
  Administered 2017-02-25: 50 ug via INTRAVENOUS
  Filled 2017-02-25: qty 2

## 2017-02-25 MED ORDER — BUPIVACAINE HCL (PF) 0.25 % IJ SOLN
INTRAMUSCULAR | Status: AC
  Filled 2017-02-25: qty 30

## 2017-02-25 MED ORDER — ROPIVACAINE HCL 5 MG/ML IJ SOLN
INTRAMUSCULAR | Status: AC
  Filled 2017-02-25: qty 30

## 2017-02-25 MED ORDER — POVIDONE-IODINE 7.5 % EX SOLN
Freq: Once | CUTANEOUS | Status: DC
  Filled 2017-02-25: qty 118

## 2017-02-25 MED ORDER — SEVOFLURANE IN SOLN
RESPIRATORY_TRACT | Status: AC
  Filled 2017-02-25: qty 250

## 2017-02-25 MED ORDER — ONDANSETRON HCL 4 MG/2ML IJ SOLN
INTRAMUSCULAR | Status: AC
  Filled 2017-02-25: qty 2

## 2017-02-25 MED ORDER — FAMOTIDINE 20 MG PO TABS
ORAL_TABLET | ORAL | Status: AC
  Administered 2017-02-25: 20 mg via ORAL
  Filled 2017-02-25: qty 1

## 2017-02-25 MED ORDER — ONDANSETRON HCL 4 MG/2ML IJ SOLN: 4.0000 mg | Freq: Four times a day (QID) | INTRAMUSCULAR | Status: DC | PRN

## 2017-02-25 MED ORDER — LIDOCAINE HCL (PF) 1 % IJ SOLN
INTRAMUSCULAR | Status: DC | PRN
  Administered 2017-02-25: 100 mL
  Administered 2017-02-25: 5 mL via SUBCUTANEOUS

## 2017-02-25 MED ORDER — LACTATED RINGERS IV SOLN
INTRAVENOUS | Status: DC
  Administered 2017-02-25 (×2): via INTRAVENOUS

## 2017-02-25 MED ORDER — ONDANSETRON HCL 4 MG PO TABS: 4.0000 mg | ORAL_TABLET | Freq: Four times a day (QID) | ORAL | Status: DC | PRN

## 2017-02-25 MED ORDER — DEXAMETHASONE SODIUM PHOSPHATE 10 MG/ML IJ SOLN
INTRAMUSCULAR | Status: DC | PRN
  Administered 2017-02-25: 10 mg via INTRAVENOUS

## 2017-02-25 MED ORDER — GLYCOPYRROLATE 0.2 MG/ML IJ SOLN
INTRAMUSCULAR | Status: AC
  Filled 2017-02-25: qty 1

## 2017-02-25 MED ORDER — BUPIVACAINE HCL 0.25 % IJ SOLN
INTRAMUSCULAR | Status: DC | PRN
  Administered 2017-02-25: 30 mL

## 2017-02-25 MED ORDER — ROPIVACAINE HCL 5 MG/ML IJ SOLN
INTRAMUSCULAR | Status: DC | PRN
  Administered 2017-02-25: 30 mL via PERINEURAL

## 2017-02-25 MED ORDER — FENTANYL CITRATE (PF) 100 MCG/2ML IJ SOLN: 25.0000 ug | INTRAMUSCULAR | Status: DC | PRN

## 2017-02-25 MED ORDER — DEXAMETHASONE SODIUM PHOSPHATE 10 MG/ML IJ SOLN
INTRAMUSCULAR | Status: AC
  Filled 2017-02-25: qty 1

## 2017-02-25 SURGICAL SUPPLY — 43 items
ADAPTER IRRIG TUBE 2 SPIKE SOL (ADAPTER) ×4 IMPLANT
ARTHROWAND PARAGON T2 (SURGICAL WAND) ×2
BANDAGE ELASTIC 4 LF NS (GAUZE/BANDAGES/DRESSINGS) ×4 IMPLANT
BANDAGE STRETCH 3X4.1 STRL (GAUZE/BANDAGES/DRESSINGS) ×2 IMPLANT
BLADE FULL RADIUS 2.9 (BLADE) ×2 IMPLANT
BNDG COHESIVE 4X5 TAN STRL (GAUZE/BANDAGES/DRESSINGS) ×2 IMPLANT
BNDG ESMARK 4X12 TAN STRL LF (GAUZE/BANDAGES/DRESSINGS) ×2 IMPLANT
BNDG GAUZE 4.5X4.1 6PLY STRL (MISCELLANEOUS) ×2 IMPLANT
BUR AGGRESSIVE+ 2.5 (BURR) IMPLANT
DURAPREP 26ML APPLICATOR (WOUND CARE) ×2 IMPLANT
ETHIBOND 2 0 GREEN CT 2 30IN (SUTURE) ×2 IMPLANT
GAUZE STRETCH 2X75IN STRL (MISCELLANEOUS) ×2 IMPLANT
GLOVE BIO SURGEON STRL SZ7.5 (GLOVE) ×2 IMPLANT
GLOVE INDICATOR 8.0 STRL GRN (GLOVE) ×2 IMPLANT
GOWN STRL REUS W/ TWL LRG LVL3 (GOWN DISPOSABLE) ×2 IMPLANT
GOWN STRL REUS W/TWL LRG LVL3 (GOWN DISPOSABLE) ×2
IV LACTATED RINGER IRRG 3000ML (IV SOLUTION) ×5
IV LR IRRIG 3000ML ARTHROMATIC (IV SOLUTION) ×5 IMPLANT
KIT RM TURNOVER STRD PROC AR (KITS) ×2 IMPLANT
LABEL OR SOLS (LABEL) ×2 IMPLANT
MANIFOLD NEPTUNE II (INSTRUMENTS) ×2 IMPLANT
NDL SAFETY ECLIPSE 18X1.5 (NEEDLE) ×1 IMPLANT
NEEDLE HYPO 18GX1.5 SHARP (NEEDLE) ×1
NEEDLE HYPO 25X1 1.5 SAFETY (NEEDLE) ×2 IMPLANT
PACK ARTHROSCOPY KNEE (MISCELLANEOUS) ×2 IMPLANT
PENCIL ELECTRO HAND CTR (MISCELLANEOUS) ×2 IMPLANT
SET TUBE SUCT SHAVER OUTFL 24K (TUBING) ×2 IMPLANT
SET TUBE TIP INTRA-ARTICULAR (MISCELLANEOUS) ×2 IMPLANT
STOCKINETTE M/LG 89821 (MISCELLANEOUS) ×2 IMPLANT
STRAP SAFETY BODY (MISCELLANEOUS) ×2 IMPLANT
SUT ETH BLK MONO 3 0 FS 1 12/B (SUTURE) ×2 IMPLANT
SUT ETHIBOND GREEN BRAID 0S 4 (SUTURE) ×8 IMPLANT
SUT ETHILON 4-0 (SUTURE) ×1
SUT ETHILON 4-0 FS2 18XMFL BLK (SUTURE) ×1
SUT VIC AB 3-0 SH 27 (SUTURE) ×1
SUT VIC AB 3-0 SH 27X BRD (SUTURE) ×1 IMPLANT
SUT VIC AB 4-0 SH 27 (SUTURE) ×1
SUT VIC AB 4-0 SH 27XANBCTRL (SUTURE) ×1 IMPLANT
SUTURE ETHLN 4-0 FS2 18XMF BLK (SUTURE) ×1 IMPLANT
TUBING ARTHRO INFLOW-ONLY STRL (TUBING) ×2 IMPLANT
TUBING CONNECTING 10 (TUBING) ×2 IMPLANT
WAND ARTHRO PARAGON T2 (SURGICAL WAND) ×1 IMPLANT
WAND TOPAZ MICRO DEBRIDER (MISCELLANEOUS) ×2 IMPLANT

## 2017-02-25 NOTE — OR Nursing (Signed)
Incentive spirometer instructed with return  demonstration

## 2017-02-25 NOTE — Anesthesia Procedure Notes (Signed)
Anesthesia Regional Block: Popliteal block   Pre-Anesthetic Checklist: ,, timeout performed, Correct Patient, Correct Site, Correct Laterality, Correct Procedure, Correct Position, site marked, Risks and benefits discussed,  Surgical consent,  Pre-op evaluation,  At surgeon's request and post-op pain management  Laterality: Lower and Left  Prep: chloraprep       Needles:  Injection technique: Single-shot  Needle Type: Echogenic Needle     Needle Length: 9cm  Needle Gauge: 21     Additional Needles:   Procedures:,,,, ultrasound used (permanent image in chart),,,,  Narrative:  Start time: 02/25/2017 8:30 AM End time: 02/25/2017 8:36 AM Injection made incrementally with aspirations every 5 mL.  Performed by: Personally  Anesthesiologist: Lenard SimmerKarenz, Donnel Venuto, MD  Additional Notes: Functioning IV was confirmed and monitors were applied.  A echogenic needle was used. Sterile prep,hand hygiene and sterile gloves were used. Minimal sedation used for procedure.   No paresthesia endorsed by patient during the procedure.  Negative aspiration and negative test dose prior to incremental administration of local anesthetic. The patient tolerated the procedure well with no immediate complications.

## 2017-02-25 NOTE — Anesthesia Preprocedure Evaluation (Signed)
Anesthesia Evaluation  Patient identified by MRN, date of birth, ID band Patient awake    Reviewed: Allergy & Precautions, H&P , NPO status , Patient's Chart, lab work & pertinent test results, reviewed documented beta blocker date and time   History of Anesthesia Complications Negative for: history of anesthetic complications  Airway Mallampati: I  TM Distance: >3 FB Neck ROM: full    Dental  (+) Caps, Dental Advidsory Given Permanent bridge:   Pulmonary neg pulmonary ROS,           Cardiovascular Exercise Tolerance: Good negative cardio ROS       Neuro/Psych neg Seizures  Neuromuscular disease negative psych ROS   GI/Hepatic negative GI ROS, Neg liver ROS,   Endo/Other  negative endocrine ROS  Renal/GU negative Renal ROS  negative genitourinary   Musculoskeletal   Abdominal   Peds  Hematology negative hematology ROS (+)   Anesthesia Other Findings Past Medical History: No date: History of kidney stones     Comment:  H/O   Reproductive/Obstetrics negative OB ROS                             Anesthesia Physical Anesthesia Plan  ASA: I  Anesthesia Plan: General   Post-op Pain Management:    Induction: Intravenous  PONV Risk Score and Plan: 2 and Ondansetron and Dexamethasone  Airway Management Planned: LMA  Additional Equipment:   Intra-op Plan:   Post-operative Plan: Extubation in OR  Informed Consent: I have reviewed the patients History and Physical, chart, labs and discussed the procedure including the risks, benefits and alternatives for the proposed anesthesia with the patient or authorized representative who has indicated his/her understanding and acceptance.   Dental Advisory Given  Plan Discussed with: Anesthesiologist, CRNA and Surgeon  Anesthesia Plan Comments:         Anesthesia Quick Evaluation

## 2017-02-25 NOTE — OR Nursing (Signed)
Per Dr. Ether GriffinsFowler verbal/tele [phone] - pt may adjust boot if foot rubs inside of boot to ease comfort.  Can wiggle toes, but will not be able to massage calf per Dr. Ether GriffinsFowler.  Patient/Spouse notified of same.

## 2017-02-25 NOTE — Op Note (Signed)
Operative note   Surgeon:Johnathon Mittal [employer] logistics/support/administrative officerowler    Assistant: None    Preop diagnosis: 1.  Left ankle arthritis with osteochondral defect 2.  Peroneal longus and brevis tendinitis    Postop diagnosis:Same    Procedure: 1.  Left ankle arthroscopic debridement extensive 2.  Repair of osteochondral defect left ankle 3.  Left peroneal longus and brevis tenolysis    EBL: Minimal    Anesthesia:regional and general.  Patient underwent popliteal block.  Supplemented with 0.25% Marcaine at the end of the procedure.  Total of 25 cc was used.    Hemostasis: Thigh tourniquet inflated to 250 mmHg for approximately 100 minutes    Specimen: None    Complications: None    Operative indications:James [name] is an 51 y.o. that presents today for surgical intervention.  The risks/benefits/alternatives/complications have been discussed and consent has been given.    Procedure:  Patient was brought into the OR and placed on the operating table in thesupine position. After anesthesia was obtained theleft lower extremity was prepped and draped in usual sterile fashion.  Attention was directed to the anteromedial and anterolateral aspect of the ankle where a small stab incisions were performed.  Blunt dissection was taken down to the ankle joint.  Ankle joint was then initially visualized with the small joint arthroscopy equipment.  There was noted to be marked amount of thick fibrotic tissue on the anterior aspect of the ankle joint.  Extensive debridement was then performed with a small joint shaver.  At this time the ankle joint was then evaluated.  There was noted to be a loose body along the lateral gutter and diffusely along the lateral aspect of the ankle.  Loose cartilage along the lateral gutter was noted attached with fraying.  With the use of the shaver I was able to smooth the edges.  I then removed the loose cartilage defect along the lateral gutter.  Attention was redirected to the medial gutter and medial  shoulder where a large osteochondral fragment was noted.  This was 2 cm in length upon removal.  The cartilage edges were then smoothed with a curette and smoothed with a Paragon wand.  Further extensive debridement of residual soft tissue inflammation was then performed.  The ankle joint was inspected diffusely throughout.  Closure of the incision was performed with a 3-0 nylon.  Attention was directed laterally along the peroneal tendon region at the fibular groove.  A longitudinal incision was performed.  Sharp and blunt dissection carried down to the tendon sheath.  The tendon sheath was then opened.  The peroneal tendons were inspected.  Thickening of the peroneus longus tendon was noted.  This was infiltrated with the Topaz wand.  The peroneal brevis tendon was noted to be intact with mild tenosynovitis.  This was excised.  The peroneal brevis tendon was also infiltrated with the Topaz wand.  The wound was flushed with copious amounts of irrigation.  Repair of the peroneal tendon and peroneal retinaculum was performed with 3-0 Vicryl and a 3-0 PDS for the peroneal retinaculum.  Subcutaneous tissue was reapproximated with 4-0 Vicryl and the skin with a 3-0 nylon.  All areas were then infiltrated with 0.25% bupivacaine.  Bulky sterile dressing was then applied.  Patient will be placed in an equalizer walker boot at 90 degrees.  He will remain nonweightbearing.  A prescription for Percocet was provided today.    Patient tolerated the procedure and anesthesia well.  Was transported from the OR to the PACU  with all vital signs stable and vascular status intact. To be discharged per routine protocol.  Will follow up in approximately 1 week in the outpatient clinic.

## 2017-02-25 NOTE — Discharge Instructions (Addendum)
[facility]  POST OPERATIVE INSTRUCTIONS FOR DR. TROXLER AND DR. [facility]   1. Take your medication as prescribed.  Pain medication should be taken only as needed.  2. Keep the dressing clean, dry and intact.  3. Keep your foot elevated above the heart level for the first 48 hours.  4. Walking to the bathroom and brief periods of walking are acceptable, unless we have instructed you to be non-weight bearing.  5. Always wear your post-op shoe when walking.  Always use your crutches if you are to be non-weight bearing.  6. Do not take a shower. Baths are permissible as long as the foot is kept out of the water.   7. Every hour you are awake:  - Bend your knee 15 times. - Flex foot 15 times - Massage calf 15 times  8. [facility] [phone]) if any of the following problems occur: - You develop a temperature or fever. - The bandage becomes saturated with blood. - Medication does not stop your pain. - Injury of the foot occurs. - Any symptoms of infection including redness, odor, or red streaks running from wound.    KEEP ORTH BOOT IN PLACE NON WEIGHT BEARING TO YOUR OPERATIVE FOOT - USE YOUR CRUTCHES WITH WALKING     AMBULATORY SURGERY  DISCHARGE INSTRUCTIONS   1) The drugs that you were given will stay in your system until tomorrow so for the next 24 hours you should not:  A) Drive an automobile B) Make any legal decisions C) Drink any alcoholic beverage   2) You may resume regular meals tomorrow.  Today it is better to start with liquids and gradually work up to solid foods.  You may eat anything you prefer, but it is better to start with liquids, then soup and crackers, and gradually work up to solid foods.   3) Please notify your doctor immediately if you have any unusual bleeding, trouble breathing, redness and pain at the surgery site, drainage, fever, or pain not  relieved by medication.    4) Additional Instructions:        Please contact your physician with any problems or Same Day Surgery at [phone], Monday through Friday 6 am to 4 pm, or [place] at [facility] Main number at [phone].

## 2017-02-25 NOTE — Anesthesia Post-op Follow-up Note (Signed)
Anesthesia QCDR form completed.        

## 2017-02-25 NOTE — Anesthesia Postprocedure Evaluation (Signed)
Anesthesia Post Note  Patient: James [name]  Procedure(s) Performed: ANKLE ARTHROSCOPY/left ocd repair/29891, (Left Ankle)  Patient location during evaluation: PACU Anesthesia Type: General Level of consciousness: awake and alert Pain management: pain level controlled Vital Signs Assessment: post-procedure vital signs reviewed and stable Respiratory status: spontaneous breathing, nonlabored ventilation, respiratory function stable and patient connected to nasal cannula oxygen Cardiovascular status: blood pressure returned to baseline and stable Postop Assessment: no apparent nausea or vomiting Anesthetic complications: no     Last Vitals:  Vitals:   02/25/17 1238 02/25/17 1322  BP: 129/84 134/87  Pulse: (!) 57 65  Resp: 16 16  Temp: 36.4 C (!) 36.3 C  SpO2: 97% 100%    Last Pain:  Vitals:   02/25/17 1322  TempSrc: Temporal  PainSc:                  James [name]

## 2017-02-25 NOTE — H&P (Signed)
HISTORY AND PHYSICAL INTERVAL NOTE:  02/25/2017  8:51 AM  James RuizJohn [name]  has presented today for surgery, with the diagnosis of osteochodral defect left ankle/M95.8.  The various methods of treatment have been discussed with the patient.  No guarantees were given.  After consideration of risks, benefits and other options for treatment, the patient has consented to surgery.  I have reviewed the patients' chart and labs.  Lungs: CTA CV:  RRR, no murmur    Patient Vitals for the past 24 hrs:  BP Temp Temp src Pulse Resp SpO2 Weight  02/25/17 0841 (!) 137/98 - - 60 10 99 % -  02/25/17 0836 130/90 - - (!) 58 20 99 % -  02/25/17 0831 (!) 134/93 - - 62 18 98 % -  02/25/17 0826 (!) 130/103 - - 66 17 100 % -  02/25/17 0821 (!) 129/98 - - 60 (!) 22 100 % -  02/25/17 0734 130/86 (!) 97.2 F (36.2 C) Temporal 77 18 98 % 77.1 kg (170 lb)    [name] history and physical examination was performed in my office.  The patient was reexamined.  There have been no changes to this history and physical examination.  James [name], James [name]

## 2017-02-25 NOTE — Anesthesia Procedure Notes (Signed)
Procedure Name: LMA Insertion Date/Time: 02/25/2017 9:53 AM Performed by: Paulette BlanchParas, Intisar Claudio, CRNA Pre-anesthesia Checklist: Patient identified, Patient being monitored, Timeout performed, Emergency Drugs available and Suction available Patient Re-evaluated:Patient Re-evaluated prior to induction Oxygen Delivery Method: Circle system utilized Preoxygenation: Pre-oxygenation with 100% oxygen Induction Type: IV induction Ventilation: Mask ventilation without difficulty LMA: LMA inserted LMA Size: 4.5 Tube type: Oral Number of attempts: 1 Placement Confirmation: positive ETCO2 and breath sounds checked- equal and bilateral Tube secured with: Tape Dental Injury: Teeth and Oropharynx as per pre-operative assessment

## 2017-02-25 NOTE — Transfer of Care (Signed)
Immediate Anesthesia Transfer of Care Note  Patient: James [name]  Procedure(s) Performed: ANKLE ARTHROSCOPY/left ocd repair/29891, (Left Ankle)  Patient Location: PACU  Anesthesia Type:General  Level of Consciousness: awake, alert  and oriented  Airway & Oxygen Therapy: Patient Spontanous Breathing and Patient connected to face mask oxygen  Post-op Assessment: Report given to RN and Post -op Vital signs reviewed and stable  Post vital signs: Reviewed and stable  Last Vitals:  Vitals:   02/25/17 0901 02/25/17 0906  BP: 126/87 126/90  Pulse: (!) 50 (!) 58  Resp: 16 17  Temp:    SpO2: 99% 99%    Last Pain:  Vitals:   02/25/17 0734  TempSrc: Temporal         Complications: No apparent anesthesia complications

## 2017-02-26 ENCOUNTER — Encounter: Payer: Self-pay | Admitting: Podiatry

## 2019-01-30 ENCOUNTER — Other Ambulatory Visit: Payer: Self-pay

## 2019-01-30 DIAGNOSIS — Z20822 Contact with and (suspected) exposure to covid-19: Secondary | ICD-10-CM

## 2019-01-31 LAB — NOVEL CORONAVIRUS, NAA: SARS-CoV-2, NAA: NOT DETECTED

## 2019-08-16 ENCOUNTER — Other Ambulatory Visit: Payer: Self-pay

## 2019-08-16 ENCOUNTER — Ambulatory Visit: Payer: Self-pay | Admitting: Dermatology

## 2023-06-14 ENCOUNTER — Ambulatory Visit
Admission: RE | Admit: 2023-06-14 | Discharge: 2023-06-14 | Disposition: A | Discharge status: Home / Self Care | Payer: Self-pay | Source: Ambulatory Visit

## 2023-06-14 VITALS — BP 127/82 | HR 83 | Temp 98.2°F | Resp 18

## 2023-06-14 DIAGNOSIS — J069 Acute upper respiratory infection, unspecified: Secondary | ICD-10-CM | POA: Diagnosis not present

## 2023-06-14 HISTORY — DX: Essential (primary) hypertension: I10

## 2023-06-14 MED ORDER — AZITHROMYCIN 250 MG PO TABS: 250.0000 mg | ORAL_TABLET | Freq: Every day | ORAL | 0 refills | Status: AC

## 2023-06-14 NOTE — ED Triage Notes (Signed)
 Patient to Urgent Care with complaints of persistent cough.   Symptoms started three weeks ago. Reports over the last week cough has become productive.  Meds: tylenol cold and cough at night time.

## 2023-06-14 NOTE — Discharge Instructions (Addendum)
 Take the Zithromax as directed.  Follow up with your primary care provider if your symptoms are not improving.

## 2023-06-14 NOTE — ED Provider Notes (Signed)
 James [name]    CSN: [id number] Arrival date & time: 06/14/23  1158      History   Chief Complaint Chief Complaint  Patient presents with   Cough    HPI James [name] is a 58 y.o. male.  Patient presents with 3-week history of productive cough.  No fever or shortness of breath.  Treatment attempted with OTC cold and cough medication.  No pertinent medical history.  The history is provided by the patient and medical records.    Past Medical History:  Diagnosis Date   History of kidney stones    H/O   Hypertension     Patient Active Problem List   Diagnosis Date Noted   Cervical radiculitis 09/17/2014    Past Surgical History:  Procedure Laterality Date   ANKLE ARTHROSCOPY Left 02/25/2017   Procedure: ANKLE ARTHROSCOPY/left ocd repair/29891,;  Surgeon: Anell Baptist, DPM;  Location: [facility] ORS;  Service: Podiatry;  Laterality: Left;   SEPTOPLASTY  2013       Home Medications    Prior to Admission medications   Medication Sig Start Date End Date Taking? Authorizing Provider  amLODipine (NORVASC) 2.5 MG tablet Take 1 tablet by mouth daily. 09/10/22 09/10/23 Yes [provider]  atorvastatin (LIPITOR) 10 MG tablet Take 1 tablet by mouth daily. 09/16/22 09/16/23 Yes [provider]  azithromycin (ZITHROMAX) 250 MG tablet Take 1 tablet (250 mg total) by mouth daily. Take first 2 tablets together, then 1 every day until finished. 06/14/23  Yes Wellington Half, NP  chlorthalidone (HYGROTON) 25 MG tablet Take by mouth. 09/10/22 09/10/23 Yes [provider]  gabapentin (NEURONTIN) 300 MG capsule Take by mouth. Patient not taking: Reported on 06/14/2023 05/09/23   [provider]  oxyCODONE-acetaminophen (ROXICET) 5-325 MG tablet Take 1-2 tablets by mouth every 6 (six) hours as needed for severe pain. Patient not taking: Reported on 06/14/2023 02/25/17   Anell Baptist, DPM    Family History History reviewed. No pertinent family  history.  Social History Social History   Tobacco Use   Smoking status: Never   Smokeless tobacco: Never  Vaping Use   Vaping status: Never Used  Substance Use Topics   Alcohol use: No   Drug use: No     Allergies   Etodolac   Review of Systems Review of Systems  Constitutional:  Negative for chills and fever.  HENT:  Negative for ear pain and sore throat.   Respiratory:  Positive for cough. Negative for shortness of breath.      Physical Exam Triage Vital Signs ED Triage Vitals [06/14/23 1213]  Encounter Vitals Group     BP 127/82     Systolic BP Percentile      Diastolic BP Percentile      Pulse Rate 83     Resp 18     Temp 98.2 F (36.8 C)     Temp src      SpO2 95 %     Weight      Height      Head Circumference      Peak Flow      Pain Score      Pain Loc      Pain Education      Exclude from Growth Chart    No data found.  Updated Vital Signs BP 127/82   Pulse 83   Temp 98.2 F (36.8 C)   Resp 18   SpO2 95%   Visual Acuity Right  Eye Distance:   Left Eye Distance:   Bilateral Distance:    Right Eye Near:   Left Eye Near:    Bilateral Near:     Physical Exam Constitutional:      General: He is not in acute distress. HENT:     Right Ear: Tympanic membrane normal.     Left Ear: Tympanic membrane normal.     Nose: Nose normal.     Mouth/Throat:     Mouth: Mucous membranes are moist.     Pharynx: Oropharynx is clear.  Cardiovascular:     Rate and Rhythm: Normal rate and regular rhythm.     Heart sounds: Normal heart sounds.  Pulmonary:     Effort: Pulmonary effort is normal. No respiratory distress.     Breath sounds: Normal breath sounds.  Neurological:     Mental Status: He is alert.      UC Treatments / Results  Labs (all labs ordered are listed, but only abnormal results are displayed) Labs Reviewed - No data to display  EKG   Radiology No results found.  Procedures Procedures (including critical care  time)  Medications Ordered in UC Medications - No data to display  Initial Impression / Assessment and Plan / UC Course  I have reviewed the triage vital signs and the nursing notes.  Pertinent labs & imaging results that were available during my care of the patient were reviewed by me and considered in my medical decision making (see chart for details).   Acute upper respiratory infection.  Patient has been symptomatic for 3 weeks.  Treating today with Zithromax.  Education provided on upper respiratory infection.  Instructed him to continue symptomatic treatment.  Instructed him to follow-up with his PCP if he is not improving.  He agrees to plan of care.    Final Clinical Impressions(s) / UC Diagnoses   Final diagnoses:  Acute upper respiratory infection     Discharge Instructions      Take the Zithromax as directed.  Follow-up with your primary care provider if your symptoms are not improving.      ED Prescriptions     Medication Sig Dispense Auth. Provider   azithromycin (ZITHROMAX) 250 MG tablet Take 1 tablet (250 mg total) by mouth daily. Take first 2 tablets together, then 1 every day until finished. 6 tablet Wellington Half, NP      I have reviewed the PDMP during this encounter.   Wellington Half, NP 06/14/23 1302
